# Patient Record
Sex: Male | Born: 1985 | Race: Black or African American | Hispanic: No | Marital: Married | Smoking: Never smoker
Health system: Southern US, Community
[De-identification: ages and names within clinical notes are randomized; demographics above are authoritative.]

## PROBLEM LIST (undated history)

## (undated) DIAGNOSIS — I10 Essential (primary) hypertension: Secondary | ICD-10-CM

---

## 2008-01-09 ENCOUNTER — Emergency Department (HOSPITAL_COMMUNITY): Admission: EM | Admit: 2008-01-09 | Discharge: 2008-01-09 | Payer: Self-pay | Admitting: Family Medicine

## 2008-01-26 ENCOUNTER — Emergency Department (HOSPITAL_COMMUNITY): Admission: EM | Admit: 2008-01-26 | Discharge: 2008-01-26 | Payer: Self-pay | Admitting: Emergency Medicine

## 2008-01-27 ENCOUNTER — Ambulatory Visit (HOSPITAL_BASED_OUTPATIENT_CLINIC_OR_DEPARTMENT_OTHER): Admission: RE | Admit: 2008-01-27 | Discharge: 2008-01-27 | Payer: Self-pay | Admitting: Emergency Medicine

## 2010-08-29 ENCOUNTER — Inpatient Hospital Stay (INDEPENDENT_AMBULATORY_CARE_PROVIDER_SITE_OTHER)
Admission: RE | Admit: 2010-08-29 | Discharge: 2010-08-29 | Disposition: A | Discharge status: Home / Self Care | Payer: 59 | Source: Ambulatory Visit | Attending: Emergency Medicine | Admitting: Emergency Medicine

## 2010-08-29 DIAGNOSIS — K5289 Other specified noninfective gastroenteritis and colitis: Secondary | ICD-10-CM

## 2010-08-29 LAB — POCT I-STAT, CHEM 8
Calcium, Ion: 1.14 mmol/L (ref 1.12–1.32)
Glucose, Bld: 134 mg/dL — ABNORMAL HIGH (ref 70–99)
HCT: 52 % (ref 39.0–52.0)
Hemoglobin: 17.7 g/dL — ABNORMAL HIGH (ref 13.0–17.0)
Potassium: 4.3 mEq/L (ref 3.5–5.1)
TCO2: 26 mmol/L (ref 0–100)

## 2010-08-29 LAB — POCT URINALYSIS DIP (DEVICE)
Nitrite: NEGATIVE
Protein, ur: 100 mg/dL — AB
Urobilinogen, UA: 1 mg/dL (ref 0.0–1.0)

## 2011-02-05 LAB — COMPREHENSIVE METABOLIC PANEL
AST: 18
Albumin: 4.7
Alkaline Phosphatase: 60
Chloride: 104
GFR calc Af Amer: >60
Potassium: 3.7
Total Bilirubin: 1.1
Total Protein: 8.1

## 2011-02-05 LAB — POCT URINALYSIS DIP (DEVICE)
Hgb urine dipstick: NEGATIVE
Nitrite: NEGATIVE
Specific Gravity, Urine: 1.02
pH: 7.5

## 2011-02-05 LAB — CBC
Platelets: 230
RDW: 16.1 — ABNORMAL HIGH
WBC: 7

## 2011-02-05 LAB — DIFFERENTIAL
Basophils Absolute: 0
Basophils Relative: 0
Eosinophils Relative: 0
Lymphocytes Relative: 20
Monocytes Absolute: 0.6
Monocytes Relative: 8

## 2011-02-07 LAB — POCT URINALYSIS DIP (DEVICE)
Glucose, UA: NEGATIVE
Hgb urine dipstick: NEGATIVE
Nitrite: NEGATIVE
Specific Gravity, Urine: 1.02
pH: 7

## 2011-06-24 ENCOUNTER — Emergency Department (HOSPITAL_COMMUNITY)
Admission: EM | Admit: 2011-06-24 | Discharge: 2011-06-24 | Disposition: A | Discharge status: Nursing Home (Includes ICF) | Payer: 59 | Source: Home / Self Care | Attending: Family Medicine | Admitting: Family Medicine

## 2011-06-24 ENCOUNTER — Emergency Department (HOSPITAL_COMMUNITY): Payer: 59

## 2011-06-24 ENCOUNTER — Encounter (HOSPITAL_COMMUNITY): Payer: Self-pay | Admitting: *Deleted

## 2011-06-24 ENCOUNTER — Other Ambulatory Visit: Payer: Self-pay

## 2011-06-24 ENCOUNTER — Emergency Department (HOSPITAL_COMMUNITY)
Admission: EM | Admit: 2011-06-24 | Discharge: 2011-06-24 | Disposition: A | Discharge status: Home / Self Care | Payer: 59 | Attending: Emergency Medicine | Admitting: Emergency Medicine

## 2011-06-24 ENCOUNTER — Encounter (HOSPITAL_COMMUNITY): Payer: Self-pay

## 2011-06-24 DIAGNOSIS — I10 Essential (primary) hypertension: Secondary | ICD-10-CM | POA: Insufficient documentation

## 2011-06-24 DIAGNOSIS — R55 Syncope and collapse: Secondary | ICD-10-CM | POA: Insufficient documentation

## 2011-06-24 DIAGNOSIS — R1115 Cyclical vomiting syndrome unrelated to migraine: Secondary | ICD-10-CM | POA: Insufficient documentation

## 2011-06-24 DIAGNOSIS — R002 Palpitations: Secondary | ICD-10-CM | POA: Insufficient documentation

## 2011-06-24 DIAGNOSIS — R197 Diarrhea, unspecified: Secondary | ICD-10-CM | POA: Insufficient documentation

## 2011-06-24 DIAGNOSIS — R079 Chest pain, unspecified: Secondary | ICD-10-CM | POA: Insufficient documentation

## 2011-06-24 DIAGNOSIS — R109 Unspecified abdominal pain: Secondary | ICD-10-CM

## 2011-06-24 DIAGNOSIS — Z79899 Other long term (current) drug therapy: Secondary | ICD-10-CM | POA: Insufficient documentation

## 2011-06-24 HISTORY — DX: Essential (primary) hypertension: I10

## 2011-06-24 LAB — CBC
HCT: 45.1 % (ref 39.0–52.0)
Hemoglobin: 15.7 g/dL (ref 13.0–17.0)
MCH: 24.4 pg — ABNORMAL LOW (ref 26.0–34.0)
MCHC: 34.8 g/dL (ref 30.0–36.0)
MCV: 70.1 fL — ABNORMAL LOW (ref 78.0–100.0)
RBC: 6.43 MIL/uL — ABNORMAL HIGH (ref 4.22–5.81)

## 2011-06-24 LAB — POCT I-STAT, CHEM 8
Calcium, Ion: 1.12 mmol/L (ref 1.12–1.32)
Creatinine, Ser: 1.3 mg/dL (ref 0.50–1.35)
Glucose, Bld: 143 mg/dL — ABNORMAL HIGH (ref 70–99)
HCT: 52 % (ref 39.0–52.0)
Hemoglobin: 17.7 g/dL — ABNORMAL HIGH (ref 13.0–17.0)
Potassium: 4 mEq/L (ref 3.5–5.1)

## 2011-06-24 LAB — URINALYSIS, ROUTINE W REFLEX MICROSCOPIC
Bilirubin Urine: NEGATIVE
Hgb urine dipstick: NEGATIVE
Ketones, ur: NEGATIVE mg/dL
Nitrite: NEGATIVE
Specific Gravity, Urine: 1.025 (ref 1.005–1.030)
Urobilinogen, UA: 1 mg/dL (ref 0.0–1.0)

## 2011-06-24 LAB — DIFFERENTIAL
Basophils Relative: 0 % (ref 0–1)
Eosinophils Absolute: 0 10*3/uL (ref 0.0–0.7)
Eosinophils Relative: 0 % (ref 0–5)
Lymphs Abs: 0.4 10*3/uL — ABNORMAL LOW (ref 0.7–4.0)
Monocytes Absolute: 1.3 10*3/uL — ABNORMAL HIGH (ref 0.1–1.0)
Monocytes Relative: 8 % (ref 3–12)
Neutrophils Relative %: 89 % — ABNORMAL HIGH (ref 43–77)

## 2011-06-24 MED ORDER — SODIUM CHLORIDE 0.9 % IV BOLUS (SEPSIS)
1000.0000 mL | Freq: Once | INTRAVENOUS | Status: AC
  Administered 2011-06-24: 1000 mL via INTRAVENOUS

## 2011-06-24 MED ORDER — PANTOPRAZOLE SODIUM 40 MG IV SOLR
40.0000 mg | Freq: Once | INTRAVENOUS | Status: AC
  Administered 2011-06-24: 40 mg via INTRAVENOUS
  Filled 2011-06-24: qty 40

## 2011-06-24 MED ORDER — ONDANSETRON HCL 4 MG/2ML IJ SOLN
INTRAMUSCULAR | Status: AC
  Filled 2011-06-24: qty 2

## 2011-06-24 MED ORDER — GI COCKTAIL ~~LOC~~
ORAL | Status: AC
  Filled 2011-06-24: qty 30

## 2011-06-24 MED ORDER — ONDANSETRON HCL 4 MG PO TABS: 4.0000 mg | ORAL_TABLET | Freq: Four times a day (QID) | ORAL | Status: AC

## 2011-06-24 MED ORDER — DIPHENHYDRAMINE HCL 50 MG/ML IJ SOLN
25.0000 mg | Freq: Once | INTRAMUSCULAR | Status: AC
  Administered 2011-06-24: 25 mg via INTRAVENOUS
  Filled 2011-06-24: qty 1

## 2011-06-24 MED ORDER — ONDANSETRON HCL 4 MG/2ML IJ SOLN
4.0000 mg | Freq: Once | INTRAMUSCULAR | Status: AC
  Administered 2011-06-24: 4 mg via INTRAVENOUS

## 2011-06-24 MED ORDER — SODIUM CHLORIDE 0.9 % IV SOLN
INTRAVENOUS | Status: DC
  Administered 2011-06-24: 15:00:00 via INTRAVENOUS

## 2011-06-24 MED ORDER — METOCLOPRAMIDE HCL 5 MG/ML IJ SOLN
10.0000 mg | Freq: Once | INTRAMUSCULAR | Status: AC
  Administered 2011-06-24: 10 mg via INTRAVENOUS

## 2011-06-24 MED ORDER — PROMETHAZINE HCL 25 MG/ML IJ SOLN
25.0000 mg | INTRAMUSCULAR | Status: AC
  Administered 2011-06-24: 25 mg via INTRAVENOUS
  Filled 2011-06-24: qty 1

## 2011-06-24 MED ORDER — ONDANSETRON HCL 4 MG/2ML IJ SOLN
4.0000 mg | Freq: Once | INTRAMUSCULAR | Status: AC
  Administered 2011-06-24: 4 mg via INTRAVENOUS
  Filled 2011-06-24: qty 2

## 2011-06-24 MED ORDER — IOHEXOL 300 MG/ML  SOLN: 20.0000 mL | INTRAMUSCULAR | Status: AC

## 2011-06-24 MED ORDER — IOHEXOL 300 MG/ML  SOLN
100.0000 mL | Freq: Once | INTRAMUSCULAR | Status: AC | PRN
  Administered 2011-06-24: 100 mL via INTRAVENOUS

## 2011-06-24 MED ORDER — DROPERIDOL 2.5 MG/ML IJ SOLN
1.2500 mg | INTRAMUSCULAR | Status: AC
  Administered 2011-06-24: 1.25 mg via INTRAVENOUS
  Filled 2011-06-24: qty 0.5

## 2011-06-24 NOTE — ED Notes (Signed)
Sent here from [facility] for further eval of n/v/d since 0230. No relief with meds and iv fluids. Wbc 15 and had +syncopal episode at ucc.

## 2011-06-24 NOTE — ED Notes (Signed)
Pt resting quietly with no vomiting, sleeping with wife at bedside.

## 2011-06-24 NOTE — ED Provider Notes (Addendum)
History     CSN: [id number]  Arrival date & time 06/24/11  4034   First MD Initiated Contact with Patient 06/24/11 [phone]      Chief Complaint  Patient presents with  . Emesis    (Consider location/radiation/quality/duration/timing/severity/associated sxs/prior treatment) HPI Comments: Charles Hooper presents for evaluation for persistent nausea, vomiting, and diarrhea that started at 2:30 this morning. He is unable to tolerate anything by mouth. He reports persistent vomiting, upwards of 8-10 times he said. He also reports 5-6 episodes of watery diarrhea. He denies any blood in the stool. He reports eating at [facility] prior to going to bed consuming chicken nuggets, and a cheeseburger. He also ate the local burger restaurant yesterday for lunch.  He denies any fever or abdominal pain. He experienced a syncopal episode at registration.   Patient is a 26 y.o. male presenting with vomiting. The history is provided by the patient.  Emesis  This is a new problem. The current episode started 6 to 12 hours ago. The problem occurs 5 to 10 times per day. The problem has not changed since onset.The emesis has an appearance of stomach contents and bilious material. There has been no fever. Associated symptoms include diarrhea. Pertinent negatives include no abdominal pain, no chills and no fever.    Past Medical History  Diagnosis Date  . Hypertension     History reviewed. No pertinent past surgical history.  History reviewed. No pertinent family history.  History  Substance Use Topics  . Smoking status: Never Smoker   . Smokeless tobacco: Not on file  . Alcohol Use: No      Review of Systems  Constitutional: Negative.  Negative for fever and chills.  HENT: Negative.  Negative for trouble swallowing.   Eyes: Negative.   Respiratory: Negative.   Cardiovascular: Negative.   Gastrointestinal: Positive for nausea, vomiting and diarrhea. Negative for abdominal pain.  Genitourinary: Negative.    Musculoskeletal: Negative.   Skin: Negative.   Neurological: Negative.     Allergies  Review of patient's allergies indicates no known allergies.  Home Medications   Current Outpatient Rx  Name Route Sig Dispense Refill  . BISMUTH SUBSALICYLATE 262 MG PO CHEW Oral Chew 524 mg by mouth as needed.    Marland Kitchen ONDANSETRON 4 MG PO FILM Oral Take by mouth.      BP 121/61  Pulse 117  Temp(Src) 99.3 F (37.4 C) (Oral)  Resp 20  SpO2 100%  Physical Exam  Nursing note and vitals reviewed. Constitutional: He is oriented to person, place, and time. He appears well-developed and well-nourished.  HENT:  Head: Normocephalic and atraumatic.  Eyes: EOM are normal.  Neck: Normal range of motion.  Pulmonary/Chest: Effort normal.  Musculoskeletal: Normal range of motion.  Neurological: He is alert and oriented to person, place, and time.  Skin: Skin is warm and dry.  Psychiatric: His behavior is normal.    ED Course  Procedures (including critical care time)  Labs Reviewed  CBC - Abnormal; Notable for the following:    WBC 15.6 (*)    RBC 6.43 (*)    MCV 70.1 (*)    MCH 24.4 (*)    RDW 15.6 (*)    All other components within normal limits  DIFFERENTIAL - Abnormal; Notable for the following:    Neutrophils Relative 89 (*)    Neutro Abs 13.9 (*)    Lymphocytes Relative 3 (*)    Lymphs Abs 0.4 (*)    Monocytes Absolute 1.3 (*)  All other components within normal limits  POCT I-STAT, CHEM 8 - Abnormal; Notable for the following:    Glucose, Bld 143 (*)    Hemoglobin 17.7 (*)    All other components within normal limits   No results found.   No diagnosis found.    MDM  Transferred to ED after no improvement with 2 L NS, 8 mg ondansetron IV, 10 mg Reglan IV, abnormal WBC        Richardo Priest, MD 06/24/11 1351  Richardo Priest, MD 06/24/11 1352

## 2011-06-24 NOTE — Discharge Instructions (Signed)
Transferred to Emergency Department for further evaluation.

## 2011-06-24 NOTE — ED Notes (Signed)
475 ml of urine collected dark yellow and concentrated

## 2011-06-24 NOTE — ED Notes (Signed)
Pt taking ice chips and states he feels much better.

## 2011-06-24 NOTE — ED Notes (Signed)
Pt had stopped vomiting and was feeling much better and I was going in to give pt a GI cocktail and he started vomiting again.  Dr Juanetta Gosling in to see pt.

## 2011-06-24 NOTE — ED Provider Notes (Signed)
History     CSN: [id number]  Arrival date & time 06/24/11  1407   First MD Initiated Contact with Patient 06/24/11 1457      Chief Complaint  Patient presents with  . Emesis  . Diarrhea    (Consider location/radiation/quality/duration/timing/severity/associated sxs/prior treatment) HPI Comments: 26 year old male sent from urgent care for persistent nausea, vomiting and diarrhea started around 2 AM. He's vomited at least 10 times and had 5 or 6 episodes of watery nonbloody stools. He was recently exposed to a friend childhood a similar GI illness. Denies any fevers. Did have a syncopal episode at urgent care. He was given 2 L of fluid, Zofran and Reglan without relief. He did have annormal white blood cell count of 15. Denies abdominal pain.  The history is provided by the patient.    Past Medical History  Diagnosis Date  . Hypertension     History reviewed. No pertinent past surgical history.  History reviewed. No pertinent family history.  History  Substance Use Topics  . Smoking status: Never Smoker   . Smokeless tobacco: Not on file  . Alcohol Use: No      Review of Systems  Constitutional: Positive for activity change and appetite change. Negative for fever.  HENT: Negative for congestion and rhinorrhea.   Eyes: Negative for visual disturbance.  Respiratory: Negative for cough, chest tightness and shortness of breath.   Cardiovascular: Positive for chest pain.  Gastrointestinal: Positive for vomiting and diarrhea. Negative for abdominal pain.  Genitourinary: Negative for dysuria and hematuria.  Musculoskeletal: Negative for back pain.  Skin: Negative for rash.  Neurological: Negative for headaches.    Allergies  Review of patient's allergies indicates no known allergies.  Home Medications   Current Outpatient Rx  Name Route Sig Dispense Refill  . BISMUTH SUBSALICYLATE 262 MG PO CHEW Oral Chew 524 mg by mouth as needed. Heartburn/nausea    . ONDANSETRON  HCL 4 MG PO TABS Oral Take 4 mg by mouth every 8 (eight) hours as needed. nausea    . ONDANSETRON HCL 4 MG PO TABS Oral Take 1 tablet (4 mg total) by mouth every 6 (six) hours. 12 tablet 0  . ONDANSETRON HCL 4 MG PO TABS Oral Take 1 tablet (4 mg total) by mouth every 6 (six) hours. 12 tablet 0    BP 130/79  Pulse 79  Temp(Src) 99.3 F (37.4 C) (Oral)  Resp 18  SpO2 100%  Physical Exam  Constitutional: He is oriented to person, place, and time. He appears well-developed and well-nourished. No distress.  HENT:  Head: Normocephalic and atraumatic.  Mouth/Throat: Oropharynx is clear and moist. No oropharyngeal exudate.  Eyes: Conjunctivae are normal. Pupils are equal, round, and reactive to light.  Neck: Normal range of motion. Neck supple.  Cardiovascular: Normal rate, regular rhythm and normal heart sounds.   Pulmonary/Chest: Effort normal and breath sounds normal. No respiratory distress.  Abdominal: Soft. There is no tenderness. There is no rebound and no guarding.       No pain at McBurney's point  Genitourinary: Penis normal.  Musculoskeletal: Normal range of motion. He exhibits no edema and no tenderness.  Neurological: He is alert and oriented to person, place, and time. No cranial nerve deficit.  Skin: Skin is warm.    ED Course  Procedures (including critical care time)   Labs Reviewed  URINALYSIS, ROUTINE W REFLEX MICROSCOPIC  LAB REPORT - SCANNED   Ct Abdomen Pelvis W Contrast  06/24/2011  *RADIOLOGY REPORT*  Clinical Data: Nausea, vomiting and diarrhea.  CT ABDOMEN AND PELVIS WITH CONTRAST  Technique:  Multidetector CT imaging of the abdomen and pelvis was performed following the standard protocol during bolus administration of intravenous contrast.  Contrast: OMNIPAQUE IOHEXOL 300 MG/ML IV SOLN  Comparison: Abdomen radiographs dated 01/09/2008.  Findings: Normal bowel gas pattern.  Small hiatal hernia.  Small amount of linear density at both lung bases.  Mild  diffuse low density of the liver relative to the spleen.  Unremarkable spleen, pancreas, gallbladder, adrenal glands, kidneys, urinary bladder and prostate gland.  No enlarged lymph nodes.  Unremarkable bones.  IMPRESSION:  1.  No acute abnormality. 2.  Small hiatal hernia with a small paraesophageal component. 3.  Mild hepatic steatosis.  Original Report Authenticated By: Darrol Angel, M.D.     1. Nausea and vomiting       MDM  Nausea, vomiting, diarrhea for the past 12 hours.  Tachycardia with syncopal episode at urgent care.   CT scan unremarkable. Patient has not had any vomiting in the ED. He is tolerating liquids by mouth. His heart rate is 110 on my recheck. He denies any chest pain, shortness of breath, abdominal pain.   Date: 06/24/2011  Rate: 103  Rhythm: sinus tachycardia  QRS Axis: normal  Intervals: normal  ST/T Wave abnormalities: nonspecific ST/T changes  Conduction Disutrbances:none  Narrative Interpretation:   Old EKG Reviewed: none available        Glynn Octave, MD 06/25/11 1041

## 2011-06-24 NOTE — ED Notes (Signed)
Pt states he started vomiting and diarrhea at 230 am today, hasn't been able to keep zofran or any fluids down.  We were called to the front and pt was found laying in the floor and states he was feeling dizzy and was attempting to sit down, denies any injury and states he "slid down the desk"

## 2011-06-24 NOTE — Discharge Instructions (Signed)
B.R.A.T. Diet Your doctor has recommended the B.R.A.T. diet for you or your child until the condition improves. This is often used to help control diarrhea and vomiting symptoms. If you or your child can tolerate clear liquids, you may have:  Bananas.   Rice.   Applesauce.   Toast (and other simple starches such as crackers, potatoes, noodles).  Be sure to avoid dairy products, meats, and fatty foods until symptoms are better. Fruit juices such as apple, grape, and prune juice can make diarrhea worse. Avoid these. Continue this diet for 2 days or as instructed by your caregiver. Document Released: 04/23/2005 Document Revised: 01/03/2011 Document Reviewed: 10/10/2006 Exit[employer] Patient Information 2012 [employer].Nausea and Vomiting Nausea is a sick feeling that often comes before throwing up (vomiting). Vomiting is a reflex where stomach contents come out of your mouth. Vomiting can cause severe loss of body fluids (dehydration). Children and elderly adults can become dehydrated quickly, especially if they also have diarrhea. Nausea and vomiting are symptoms of a condition or disease. It is important to find the cause of your symptoms. CAUSES   Direct irritation of the stomach lining. This irritation can result from increased acid production (gastroesophageal reflux disease), infection, food poisoning, taking certain medicines (such as nonsteroidal anti-inflammatory drugs), alcohol use, or tobacco use.   Signals from the brain.These signals could be caused by a headache, heat exposure, an inner ear disturbance, increased pressure in the brain from injury, infection, a tumor, or a concussion, pain, emotional stimulus, or metabolic problems.   An obstruction in the gastrointestinal tract (bowel obstruction).   Illnesses such as diabetes, hepatitis, gallbladder problems, appendicitis, kidney problems, cancer, sepsis, atypical symptoms of a heart attack, or eating disorders.   Medical  treatments such as chemotherapy and radiation.   Receiving medicine that makes you sleep (general anesthetic) during surgery.  DIAGNOSIS Your caregiver may ask for tests to be done if the problems do not improve after a few days. Tests may also be done if symptoms are severe or if the reason for the nausea and vomiting is not clear. Tests may include:  Urine tests.   Blood tests.   Stool tests.   Cultures (to look for evidence of infection).   X-rays or other imaging studies.  Test results can help your caregiver make decisions about treatment or the need for additional tests. TREATMENT You need to stay well hydrated. Drink frequently but in small amounts.You may wish to drink water, sports drinks, clear broth, or eat frozen ice pops or gelatin dessert to help stay hydrated.When you eat, eating slowly may help prevent nausea.There are also some antinausea medicines that may help prevent nausea. HOME CARE INSTRUCTIONS   Take all medicine as directed by your caregiver.   If you do not have an appetite, do not force yourself to eat. However, you must continue to drink fluids.   If you have an appetite, eat a normal diet unless your caregiver tells you differently.   Eat a variety of complex carbohydrates (rice, wheat, potatoes, bread), lean meats, yogurt, fruits, and vegetables.   Avoid high-fat foods because they are more difficult to digest.   Drink enough water and fluids to keep your urine clear or pale yellow.   If you are dehydrated, ask your caregiver for specific rehydration instructions. Signs of dehydration may include:   Severe thirst.   Dry lips and mouth.   Dizziness.   Dark urine.   Decreasing urine frequency and amount.   Confusion.     Rapid breathing or pulse.  SEEK IMMEDIATE MEDICAL CARE IF:   You have blood or brown flecks (like coffee grounds) in your vomit.   You have black or bloody stools.   You have a severe headache or stiff neck.   You  are confused.   You have severe abdominal pain.   You have chest pain or trouble breathing.   You do not urinate at least once every 8 hours.   You develop cold or clammy skin.   You continue to vomit for longer than 24 to 48 hours.   You have a fever.  MAKE SURE YOU:   Understand these instructions.   Will watch your condition.   Will get help right away if you are not doing well or get worse.  Document Released: 04/23/2005 Document Revised: 01/03/2011 Document Reviewed: 09/20/2010 [employer] Patient Information 2012 [employer]. 

## 2011-06-24 NOTE — ED Notes (Signed)
D&C pt's  IV 

## 2012-11-02 IMAGING — CT CT ABD-PELV W/ CM
2 of 4 series · 17 of 46 positions shown, 19 images · IV contrast (omnipaque)
Comparison: Abdomen radiographs dated 01/09/2008.

CLINICAL DATA: Nausea, vomiting and diarrhea.

CT ABDOMEN AND PELVIS WITH CONTRAST
TECHNIQUE: Multidetector CT imaging of the abdomen and pelvis was
performed following the standard protocol during bolus
administration of intravenous contrast.
Contrast: 100mL OMNIPAQUE IOHEXOL 300 MG/ML IV SOLN

[Series 2: abd/pelv with 5.0 b31f st · axial · 0.77mm/px · z∈[-784,-334]mm · 14 of 98 slices shown, 16 images]
[im 4/98  soft-tissue]
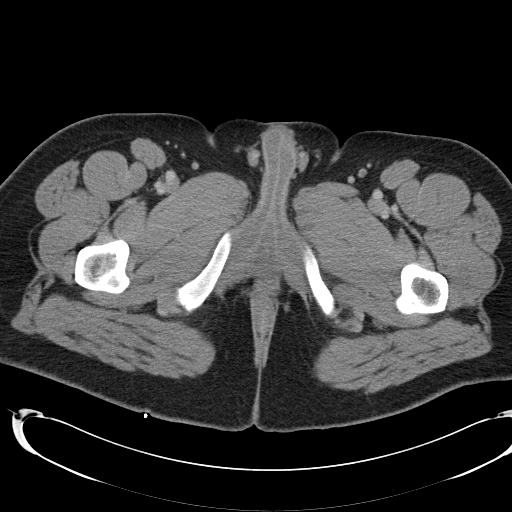
[im 4/98  bone]
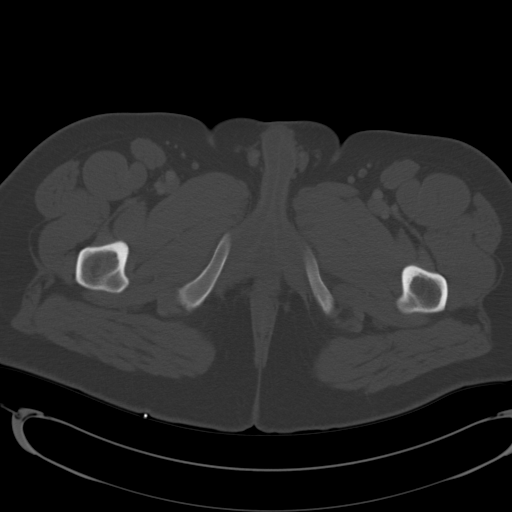
[im 12/98  soft-tissue]
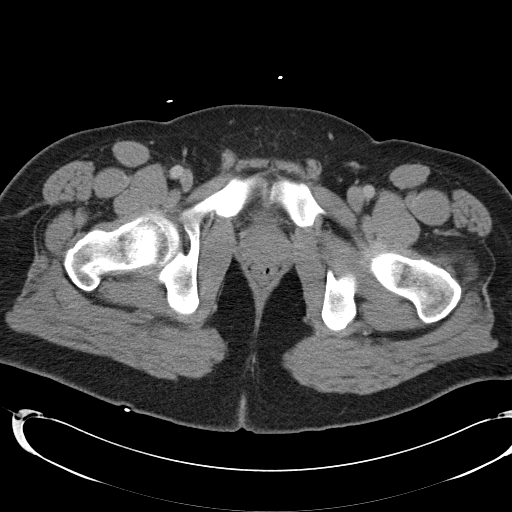
[im 20/98  soft-tissue]
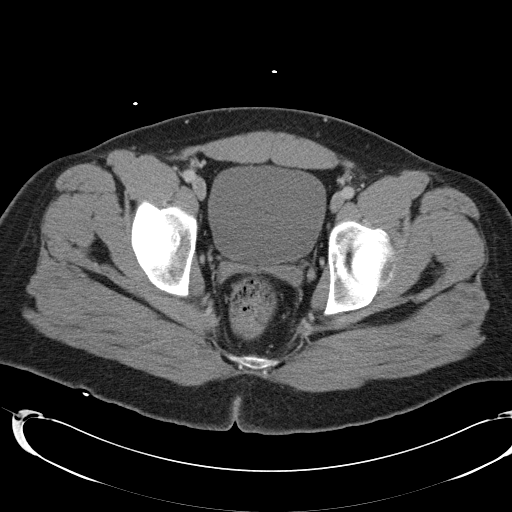
[im 28/98  soft-tissue]
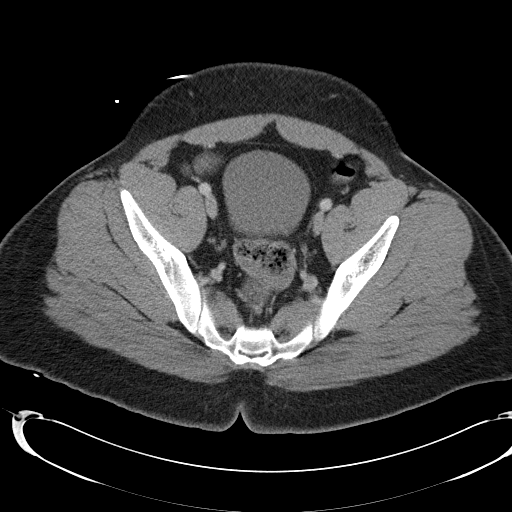
[im 32/98  soft-tissue]
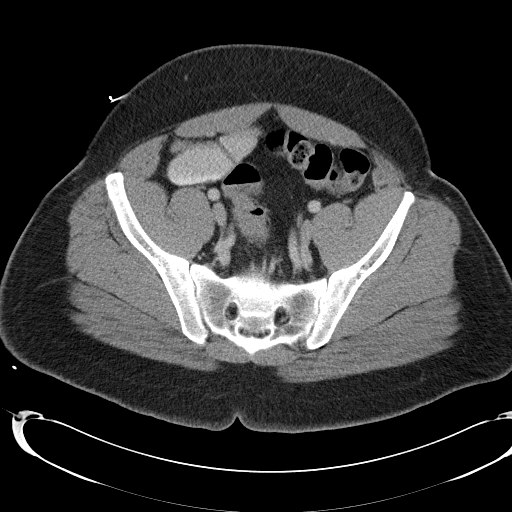
[im 39/98  soft-tissue]
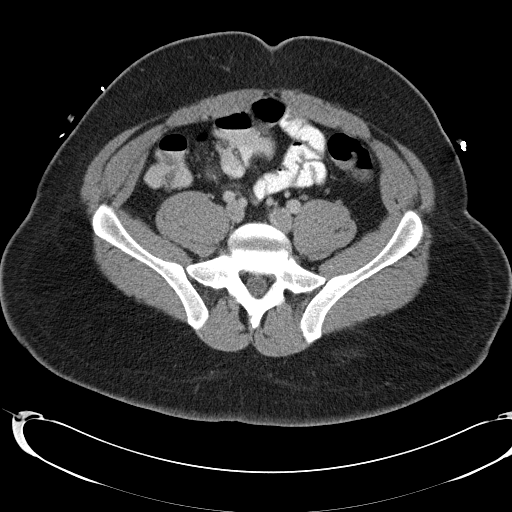
[im 47/98  soft-tissue]
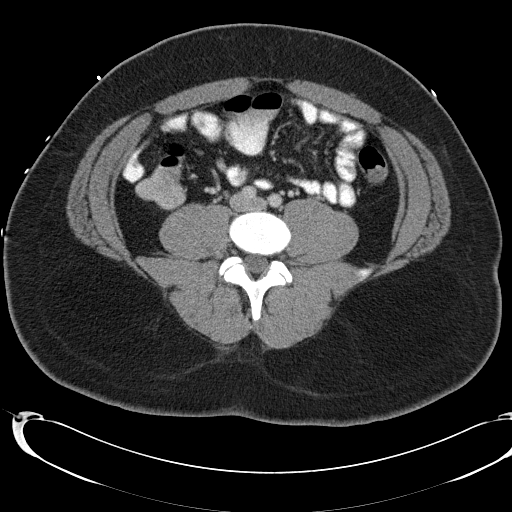
[im 51/98  soft-tissue]
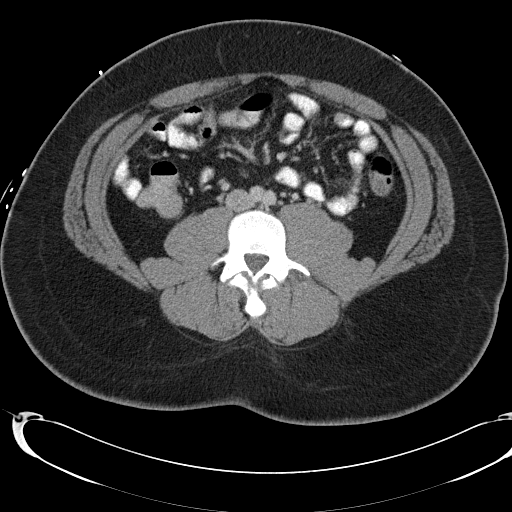
[im 59/98  soft-tissue]
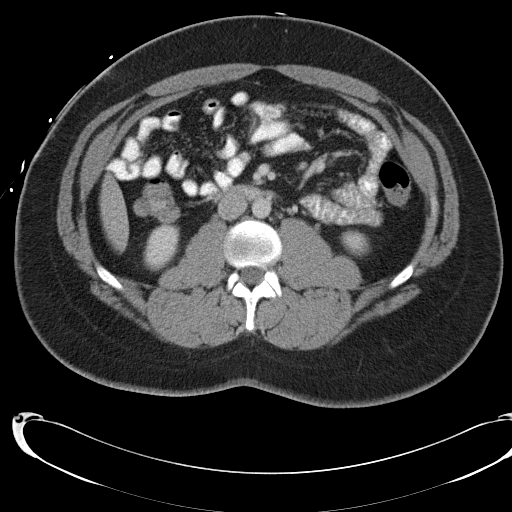
[im 59/98  bone]
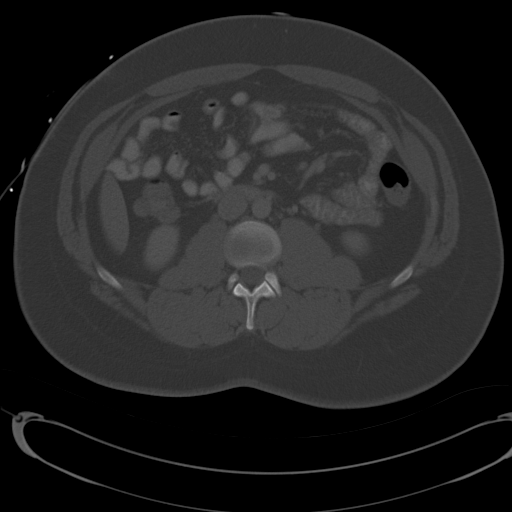
[im 66/98  soft-tissue]
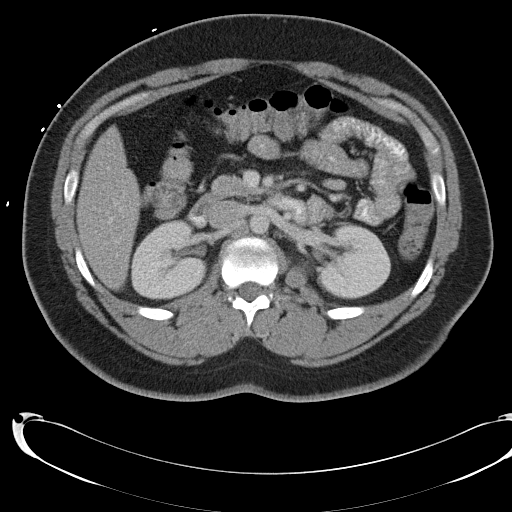
[im 74/98  soft-tissue]
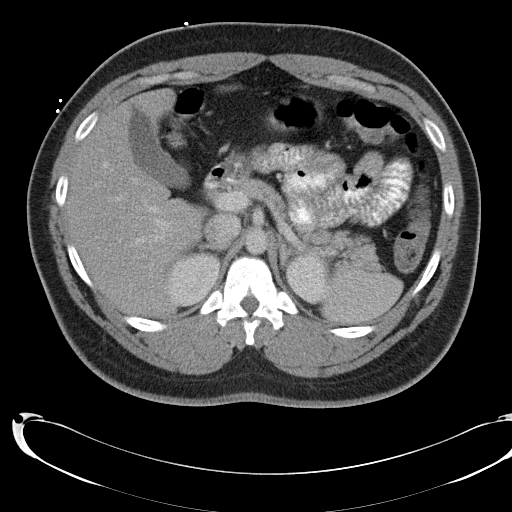
[im 78/98  soft-tissue]
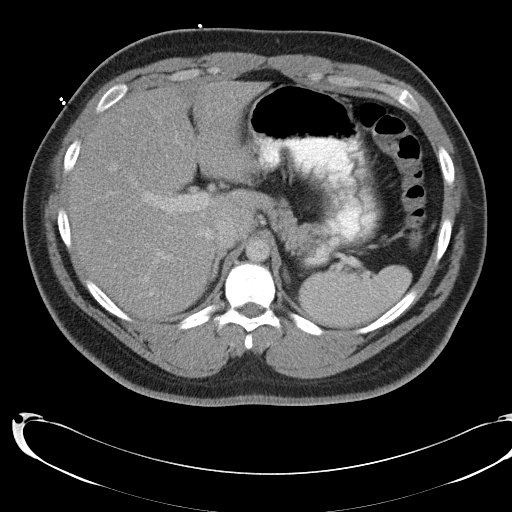
[im 86/98  soft-tissue]
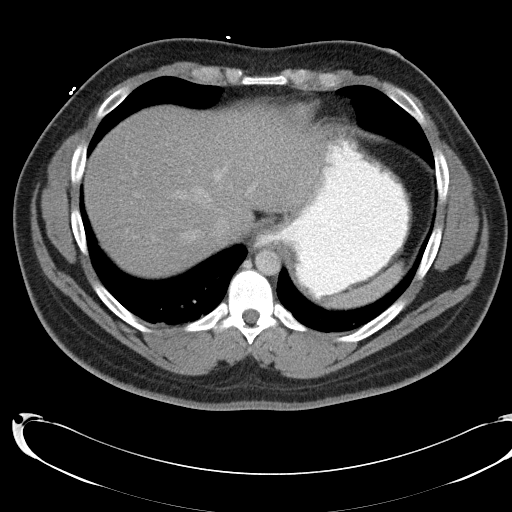
[im 94/98  soft-tissue]
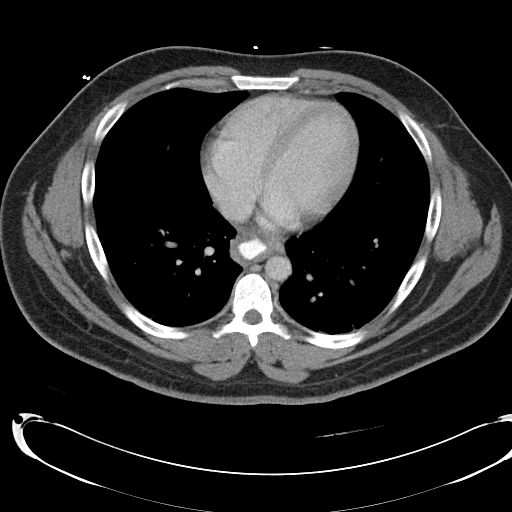

[Series 602: cor · coronal · 0.95mm/px · 3 of 136 slices shown]
[im 46/136  soft-tissue]
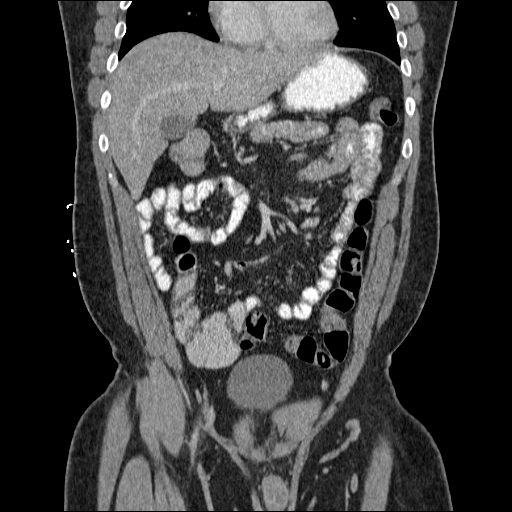
[im 61/136  soft-tissue]
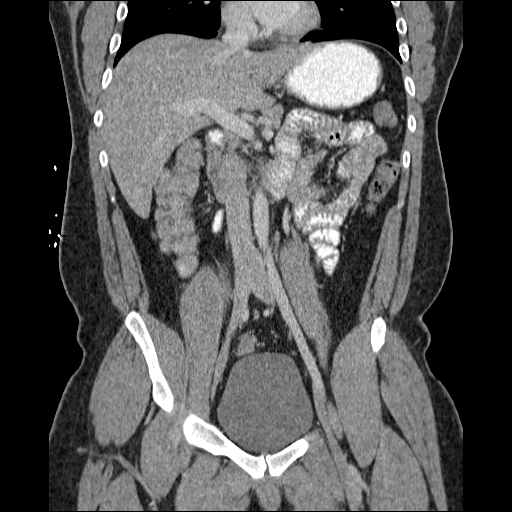
[im 76/136  soft-tissue]
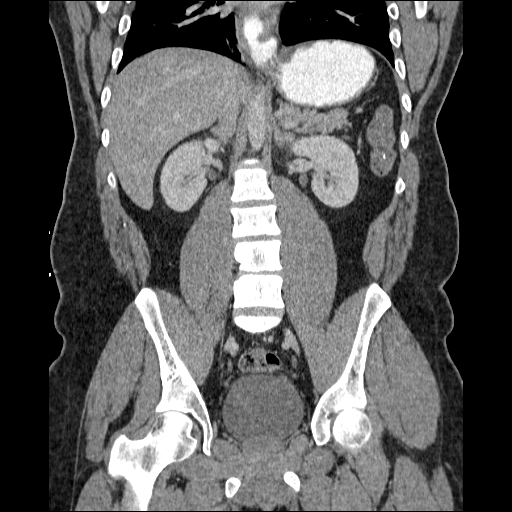

[17 of 46 positions shown; findings below may reference images not displayed]

FINDINGS: Normal bowel gas pattern.  Small hiatal hernia.  Small
amount of linear density at both lung bases.  Mild diffuse low
density of the liver relative to the spleen.

Unremarkable spleen, pancreas, gallbladder, adrenal glands,
kidneys, urinary bladder and prostate gland.  No enlarged lymph
nodes.  Unremarkable bones.
IMPRESSION: 1.  No acute abnormality.
2.  Small hiatal hernia with a small paraesophageal component.
3.  Mild hepatic steatosis.

## 2018-05-04 ENCOUNTER — Other Ambulatory Visit: Payer: Self-pay | Admitting: Physician Assistant

## 2018-05-04 MED ORDER — AMOXICILLIN-POT CLAVULANATE 875-125 MG PO TABS: 1.0000 | ORAL_TABLET | Freq: Two times a day (BID) | ORAL | 0 refills | Status: AC

## 2018-05-04 NOTE — Progress Notes (Signed)
Pt has had sinus pressure, congestion, headache, nasal congestion for 1 week. He has tried mucinex, flonase, dayquil with little relief. No fever, chills, body aches, SOB.
